# Patient Record
Sex: Male | Born: 1985 | Race: Black or African American | Hispanic: No | Marital: Single | Smoking: Never smoker
Health system: Southern US, Community
[De-identification: ages and names within clinical notes are randomized; demographics above are authoritative.]

## PROBLEM LIST (undated history)

## (undated) DIAGNOSIS — J45909 Unspecified asthma, uncomplicated: Secondary | ICD-10-CM

---

## 2009-01-31 ENCOUNTER — Emergency Department (HOSPITAL_COMMUNITY): Admission: EM | Admit: 2009-01-31 | Discharge: 2009-01-31 | Payer: Self-pay | Admitting: Emergency Medicine

## 2010-12-13 IMAGING — CR DG CHEST 2V
2 series · 2 of 2 positions shown · non-contrast
Comparison: None

CLINICAL DATA: Chest pain

CHEST - 2 VIEW

[w chest pa]
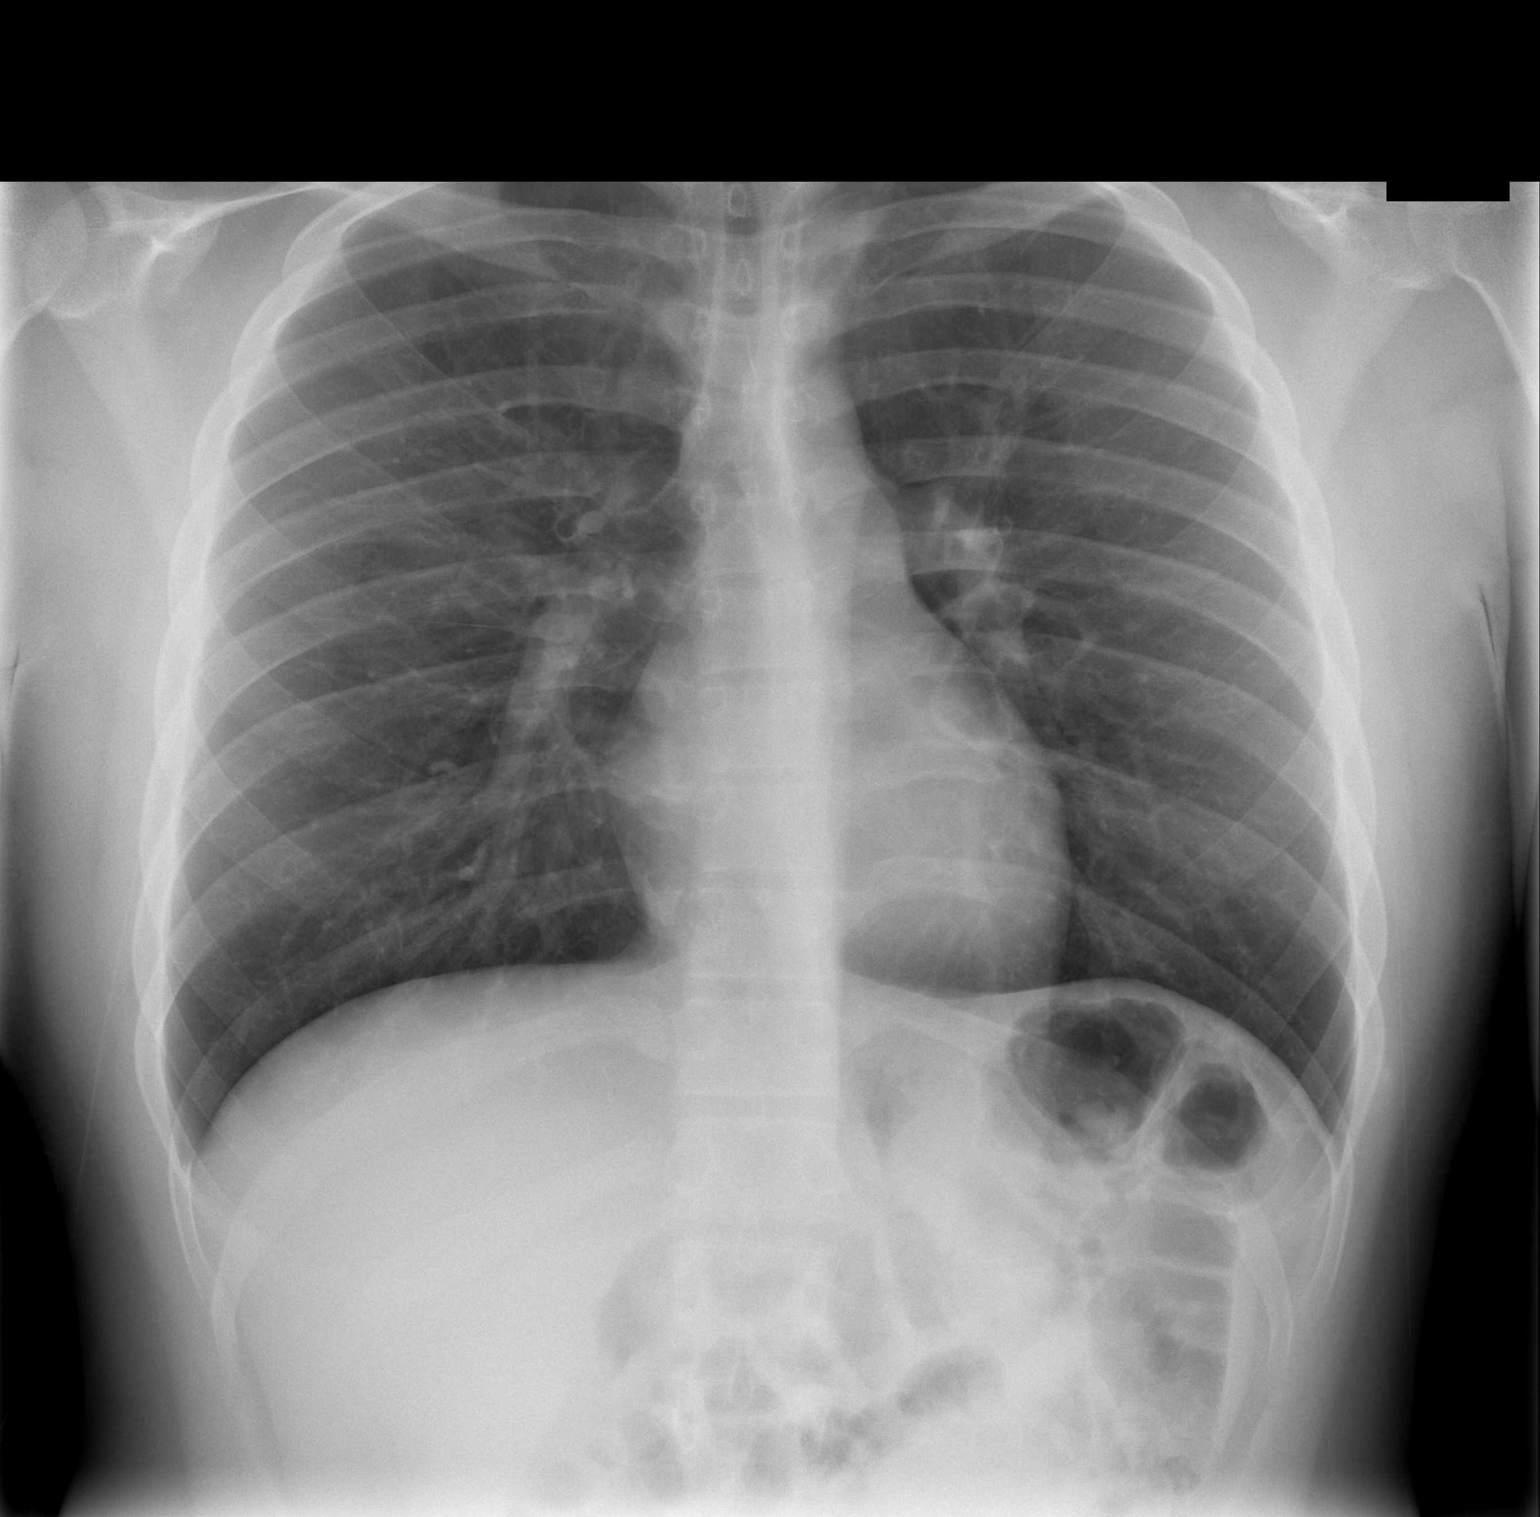

[w chest lat]
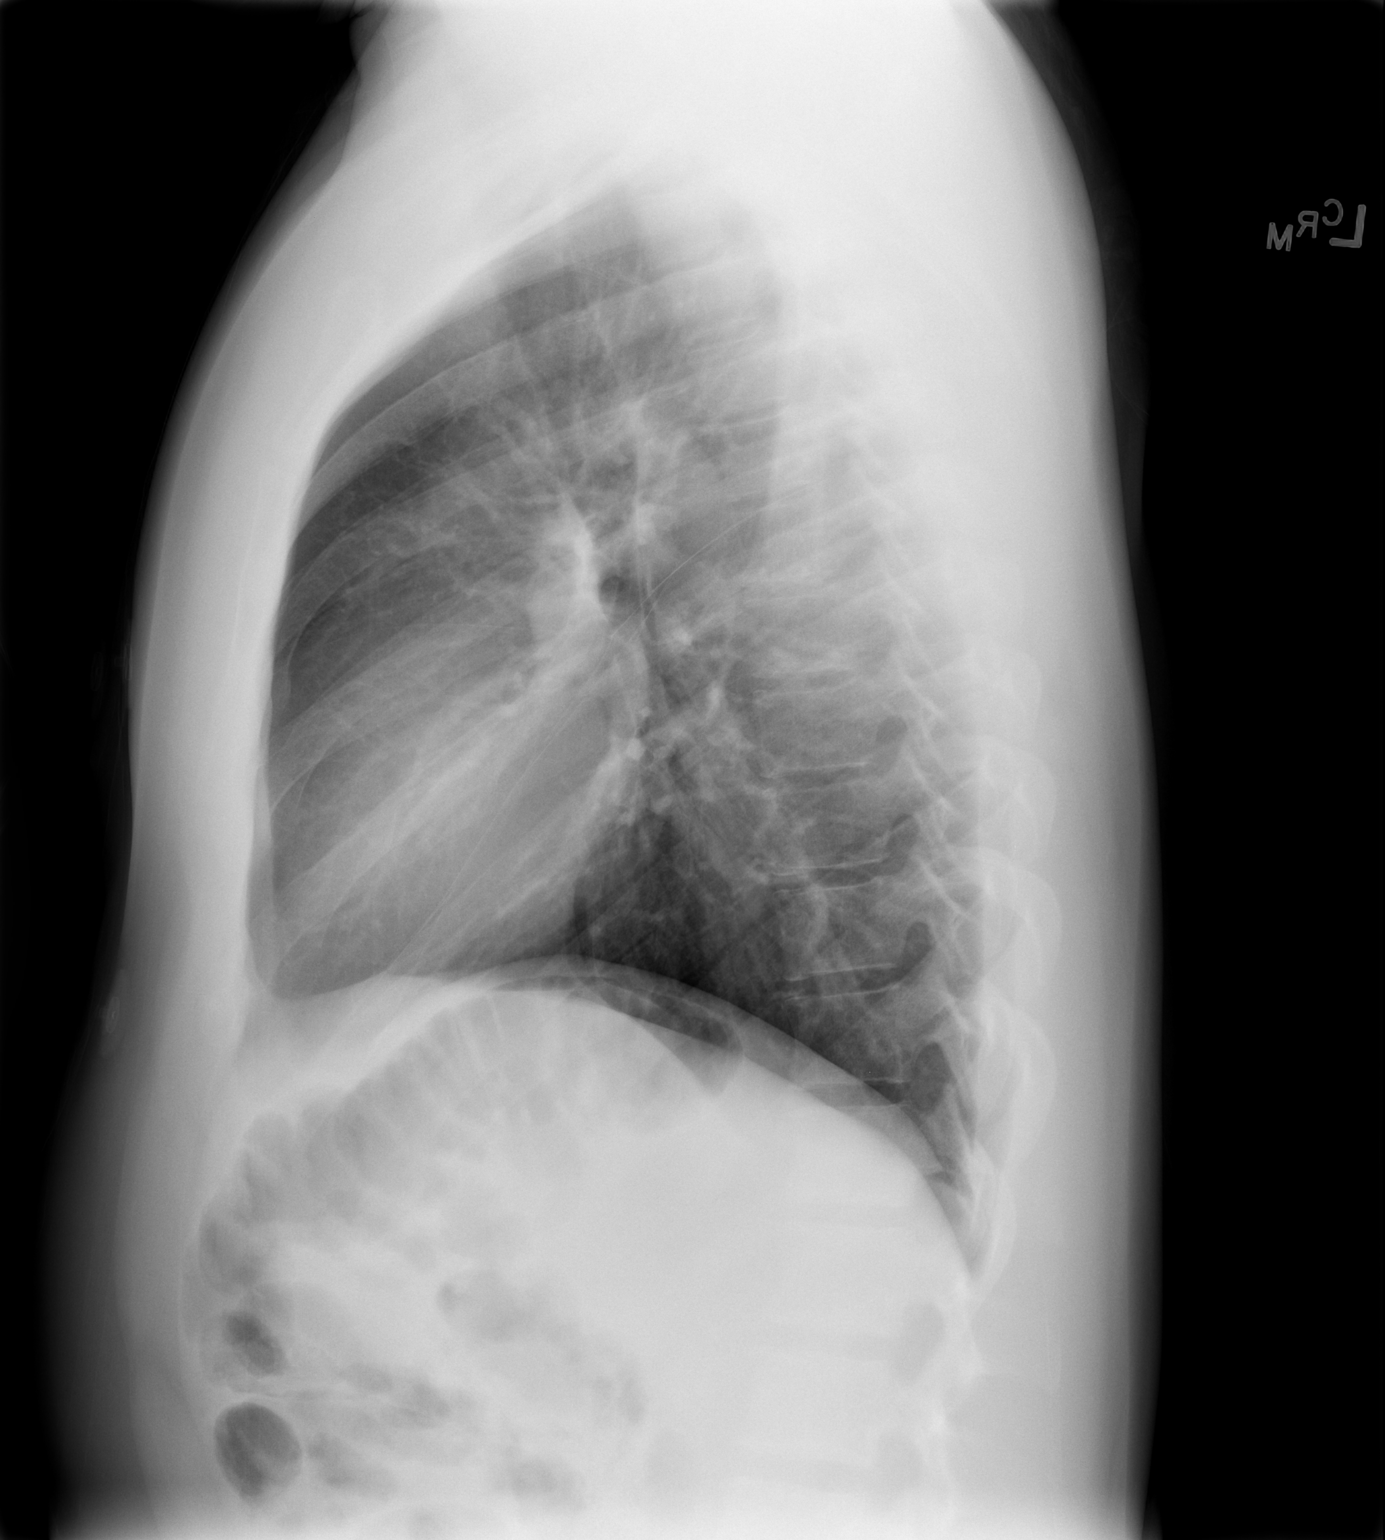

[2 of 2 positions shown; findings below may reference images not displayed]

FINDINGS: The heart size and mediastinal contours are within
normal limits.  Both lungs are clear.  The visualized skeletal
structures are unremarkable.
IMPRESSION: No active cardiopulmonary disease.

## 2013-10-06 ENCOUNTER — Encounter (HOSPITAL_COMMUNITY): Payer: Self-pay | Admitting: Emergency Medicine

## 2013-10-06 ENCOUNTER — Emergency Department (HOSPITAL_COMMUNITY): Payer: BC Managed Care – PPO

## 2013-10-06 ENCOUNTER — Emergency Department (HOSPITAL_COMMUNITY)
Admission: EM | Admit: 2013-10-06 | Discharge: 2013-10-06 | Disposition: A | Discharge status: Home / Self Care | Payer: BC Managed Care – PPO | Attending: Emergency Medicine | Admitting: Emergency Medicine

## 2013-10-06 DIAGNOSIS — J069 Acute upper respiratory infection, unspecified: Secondary | ICD-10-CM | POA: Insufficient documentation

## 2013-10-06 DIAGNOSIS — R059 Cough, unspecified: Secondary | ICD-10-CM | POA: Insufficient documentation

## 2013-10-06 DIAGNOSIS — J45901 Unspecified asthma with (acute) exacerbation: Secondary | ICD-10-CM | POA: Insufficient documentation

## 2013-10-06 DIAGNOSIS — R05 Cough: Secondary | ICD-10-CM | POA: Insufficient documentation

## 2013-10-06 HISTORY — DX: Unspecified asthma, uncomplicated: J45.909

## 2013-10-06 MED ORDER — BENZONATATE 100 MG PO CAPS: 100.0000 mg | ORAL_CAPSULE | Freq: Three times a day (TID) | ORAL | Status: DC

## 2013-10-06 NOTE — ED Provider Notes (Signed)
CSN: [id number]     Arrival date & time 10/06/13  [id number] History   First MD Initiated Contact with Patient 10/06/13 [phone]     Chief Complaint  Patient presents with  . Cough  . Sore Throat     (Consider location/radiation/quality/duration/timing/severity/associated sxs/prior Treatment) HPI Comments: Patient presents today with a chief complaint of a productive cough for the past week.  He states that the cough is gradually worsening.  Cough is worse in the morning.  He has not taken anything for his symptoms.  He reports associated SOB, but only after excessive coughing.  He also reports a "burning" sensation in his chest, but also only after excessive coughing.  He denies SOB or CP at this time.  He reports that in the morning he has irritation of his throat, but denies sore throat at this time.  He denies nasal congestion, ear pain, or sinus pressure.  He reports history of Asthma in childhood.  He reports that he smokes cigarettes occasionally.  Patient is a 28 y.o. male presenting with cough and pharyngitis. The history is provided by the patient.  Cough Associated symptoms: shortness of breath   Associated symptoms: no chest pain, no chills, no fever and no wheezing   Sore Throat Associated symptoms include coughing. Pertinent negatives include no chest pain, chills or fever.    Past Medical History  Diagnosis Date  . Asthma     in childhood   History reviewed. No pertinent past surgical history. No family history on file. History  Substance Use Topics  . Smoking status: Never Smoker   . Smokeless tobacco: Not on file  . Alcohol Use: Yes     Comment: occasionally    Review of Systems  Constitutional: Negative for fever and chills.  Respiratory: Positive for cough and shortness of breath. Negative for wheezing.   Cardiovascular: Negative for chest pain.      Allergies  Review of patient's allergies indicates no known allergies.  Home Medications   Prior to Admission  medications   Not on File   BP 128/83  Pulse 72  Temp(Src) 98.6 F (37 C) (Oral)  Resp 20  SpO2 100% Physical Exam  Nursing note and vitals reviewed. Constitutional: He appears well-developed and well-nourished.  HENT:  Head: Normocephalic and atraumatic.  Right Ear: Tympanic membrane and ear canal normal.  Left Ear: Tympanic membrane and ear canal normal.  Nose: Nose normal. Right sinus exhibits no maxillary sinus tenderness and no frontal sinus tenderness. Left sinus exhibits no maxillary sinus tenderness and no frontal sinus tenderness.  Mouth/Throat: Uvula is midline and oropharynx is clear and moist. No oropharyngeal exudate, posterior oropharyngeal edema or posterior oropharyngeal erythema.  Neck: Normal range of motion. Neck supple.  Cardiovascular: Normal rate, regular rhythm and normal heart sounds.   Pulmonary/Chest: Effort normal and breath sounds normal. No respiratory distress. He has no wheezes. He has no rales.  Lymphadenopathy:    He has no cervical adenopathy.  Neurological: He is alert.  Skin: Skin is warm and dry. He is not diaphoretic.  Psychiatric: He has a normal mood and affect.    ED Course  Procedures (including critical care time) Labs Review Labs Reviewed - No data to display  Imaging Review Dg Chest 2 View  10/06/2013   CLINICAL DATA:  Cough and chest pain  EXAM: CHEST  2 VIEW  COMPARISON:  01/31/2009  FINDINGS: The heart size and mediastinal contours are within normal limits. Both lungs are clear. The visualized  skeletal structures are unremarkable.  IMPRESSION: No active cardiopulmonary disease.   Electronically Signed   By: Christiana Pellant M.D.   On: 10/06/2013 11:02     EKG Interpretation None      MDM   Final diagnoses:  None   Pt CXR negative for acute infiltrate. Patients symptoms are consistent with URI, likely viral etiology. Discussed that antibiotics are not indicated for viral infections. Pt will be discharged with symptomatic  treatment.  Verbalizes understanding and is agreeable with plan. Pt is hemodynamically stable & in NAD prior to dc.  Patient stable for discharge.  Return precautions given.     Santiago Glad, PA-C 10/06/13 [phone]

## 2013-10-06 NOTE — ED Notes (Signed)
Pt reports cough x 1 week, with intermittent sore throat. Pt sts he recently got a new roommate who keeps AC on at night, pt sts he thinks that's what's causing cough. Denies chest pain, fever.

## 2013-10-06 NOTE — Discharge Instructions (Signed)
Read the instructions below on reasons to return to the emergency department and to learn more about your diagnosis.  Use over the counter medications for symptomatic relief as we discussed (mucinex as a decongestant, Tylenol for fever/pain, Motrin/Ibuprofen for muscle aches). Followup with your primary care doctor in 4 days if your symptoms persist.  Your more than welcome to return to the emergency department if symptoms worsen or become concerning. ° °Upper Respiratory Infection, Adult  °An upper respiratory infection (URI) is also sometimes known as the common cold. Most people improve within 1 week, but symptoms can last up to 2 weeks. A residual cough may last even longer.  ° °URI is most commonly caused by a virus. Viruses are NOT treated with antibiotics. You can easily spread the virus to others by oral contact. This includes kissing, sharing a glass, coughing, or sneezing. Touching your mouth or nose and then touching a surface, which is then touched by another person, can also spread the virus.  ° °TREATMENT  °Treatment is directed at relieving symptoms. There is no cure. Antibiotics are not effective, because the infection is caused by a virus, not by bacteria. Treatment may include:  °Increased fluid intake. Sports drinks offer valuable electrolytes, sugars, and fluids.  °Breathing heated mist or steam (vaporizer or shower).  °Eating chicken soup or other clear broths, and maintaining good nutrition.  °Getting plenty of rest.  °Using gargles or lozenges for comfort.  °Controlling fevers with ibuprofen or acetaminophen as directed by your caregiver.  °Increasing usage of your inhaler if you have asthma.  °Return to work when your temperature has returned to normal.  ° °SEEK MEDICAL CARE IF:  °After the first few days, you feel you are getting worse rather than better.  °You develop worsening shortness of breath, or brown or red sputum. These may be signs of pneumonia.  °You develop yellow or brown nasal  discharge or pain in the face, especially when you bend forward. These may be signs of sinusitis.  °You develop a fever, swollen neck glands, pain with swallowing, or white areas in the back of your throat. These may be signs of strep throat.  ° °

## 2013-10-06 NOTE — ED Notes (Addendum)
Pt reports nonproductive cough and sore throat for a week. Pt breath sounds clear.   Pt continues to reports receives these symptoms post sleeping with fan and/or cool temperatures. Pt reports new roommate sleeps as such this past week. Pt contributes symptoms to event.  PA at bedside.

## 2013-10-06 NOTE — ED Notes (Signed)
Bed: WTR5 Expected date:  Expected time:  Means of arrival:  Comments: 

## 2013-10-06 NOTE — ED Provider Notes (Signed)
Medical screening examination/treatment/procedure(s) were conducted as a shared visit with non-physician practitioner(s) and myself.  I personally evaluated the patient during the encounter.  Pt c/o non productive cough, and mild chest soreness w coughing hard.  Chest cta bil. Rrr.    Suzi RootsKevin E Klynn Linnemann, MD 10/06/13 [phone]

## 2014-02-22 ENCOUNTER — Ambulatory Visit (INDEPENDENT_AMBULATORY_CARE_PROVIDER_SITE_OTHER): Payer: BC Managed Care – PPO | Admitting: Physician Assistant

## 2014-02-22 VITALS — BP 134/74 | HR 82 | Temp 98.7°F | Resp 18 | Ht 66.5 in | Wt 167.2 lb

## 2014-02-22 DIAGNOSIS — G4452 New daily persistent headache (NDPH): Secondary | ICD-10-CM

## 2014-02-22 MED ORDER — IPRATROPIUM BROMIDE 0.03 % NA SOLN: 2.0000 | Freq: Two times a day (BID) | NASAL | Status: AC

## 2014-02-22 MED ORDER — DICLOFENAC SODIUM 75 MG PO TBEC: 75.0000 mg | DELAYED_RELEASE_TABLET | Freq: Two times a day (BID) | ORAL | Status: AC

## 2014-02-22 NOTE — Progress Notes (Signed)
Subjective:    Patient ID: Terry Terry, male    DOB: October 13, 1985, 28 y.o.   MRN: [id number]  HPI  This is a 28 year old male presenting with daily headaches for one month. He reports the headaches are present upon waking or after breakfast. The pain is described as constant and squeezing and is located in a ball-cap distribution. Pain rated 6/10. In addition he has pain at his temples and says his eyes feel irritated. With the pain he feels he need to rub his eyes or squint for relief. He has been taking BC powder every morning. Exercise and eating aggravates the pain. He notes when he eats "unhealthy" he experiences a headache more than he eats healthy. He doesn't drink caffeine, drink alcohol regularly or smoke. He drinks 48 oz water a day. He denies N/V, photophobia, phonophobia, visual disturbance, dizziness.  He reports when this all started he had a sinus infection. This resolved after 1.5 weeks. He has had more URI symptoms that started 1 week ago but these symptoms have also resolved at this point. He has had no changes in his sleep.  Review of Systems  Constitutional: Negative for fever and chills.  HENT: Negative for congestion, ear pain, sinus pressure and sore throat.   Eyes: Positive for pain. Negative for redness and visual disturbance.  Respiratory: Negative for cough.   Gastrointestinal: Negative for nausea, vomiting and diarrhea.  Skin: Negative for rash.  Neurological: Positive for headaches. Negative for dizziness.  Psychiatric/Behavioral: Negative for sleep disturbance.    There are no active problems to display for this patient.  Prior to Admission medications   Not on File   No Known Allergies  Patient's social and family history were reviewed.     Objective:   Physical Exam  Constitutional: He is oriented to person, place, and time. He appears well-developed and well-nourished. No distress.  HENT:  Head: Normocephalic and atraumatic.  Right Ear: Hearing,  external ear and ear canal normal. Tympanic membrane is retracted.  Left Ear: Hearing, external ear and ear canal normal.  Nose: Nose normal. No mucosal edema. Right sinus exhibits no maxillary sinus tenderness and no frontal sinus tenderness. Left sinus exhibits no maxillary sinus tenderness and no frontal sinus tenderness.  Mouth/Throat: Uvula is midline, oropharynx is clear and moist and mucous membranes are normal.  Left TM obstructed by cerumen.  Eyes: Conjunctivae, EOM and lids are normal. Pupils are equal, round, and reactive to light. Right eye exhibits no discharge. Left eye exhibits no discharge. No scleral icterus.  Vision screen with glasses: 20/15 in right eye, 20/15 in left eye, 20/15 with both  Neck: Trachea normal.  Cardiovascular: Normal rate, regular rhythm, normal heart sounds, intact distal pulses and normal pulses.   No murmur heard. Pulmonary/Chest: Effort normal and breath sounds normal. No respiratory distress. He has no wheezes. He has no rhonchi. He has no rales.  Musculoskeletal: Normal range of motion.       Cervical back: Normal.  Lymphadenopathy:       Head (right side): No submental, no submandibular, no tonsillar, no preauricular, no posterior auricular and no occipital adenopathy present.       Head (left side): No submental, no submandibular, no tonsillar, no preauricular, no posterior auricular and no occipital adenopathy present.    He has no cervical adenopathy.  Neurological: He is alert and oriented to person, place, and time. He has normal strength and normal reflexes. No cranial nerve deficit or sensory deficit.  Reflex Scores:      Bicep reflexes are 2+ on the right side and 2+ on the left side.      Patellar reflexes are 2+ on the right side and 2+ on the left side.      Achilles reflexes are 2+ on the right side and 2+ on the left side. Skin: Skin is warm, dry and intact. No lesion and no rash noted.  Psychiatric: He has a normal mood and affect. His  speech is normal and behavior is normal. Thought content normal.      Assessment & Plan:  1. New daily persistent headache Eustachian tube dysfunction vs. Tension headache vs. Rebound headache vs. Dehydration - Pt will d/c bc powder d/t potential of causing rebound headache. He will start taking voltaren BID x 10-14 days and will start using atrovent nasal spray BID. Advised to adequately hydrate and stay away from processed foods. Advised him to pay attention to headaches that occur after eating and make note of what he ate. He will return in 7-10 days if no improvement in his symptoms.  - diclofenac (VOLTAREN) 75 MG EC tablet; Take 1 tablet (75 mg total) by mouth 2 (two) times daily.  Dispense: 30 tablet; Refill: 0 - ipratropium (ATROVENT) 0.03 % nasal spray; Place 2 sprays into both nostrils 2 (two) times daily.  Dispense: 30 mL; Refill: 0   Amberlee Garvey V. Dyke BrackettBush, PA-C, [facility]  02/23/2014

## 2014-02-22 NOTE — Patient Instructions (Signed)
Stop taking BC powder. Start taking diclofenac twice a day. Increase your fluids to 64 oz a day. Watch your salt intake (decrease processed foods) Return in 2 weeks if not improving.

## 2014-02-23 NOTE — Progress Notes (Signed)
The patient was discussed with me and I agree with the diagnosis and treatment plan.  

## 2015-08-18 IMAGING — CR DG CHEST 2V
2 series · 2 of 2 positions shown · non-contrast
Comparison: 01/31/2009

CLINICAL DATA: Cough and chest pain

EXAM:
CHEST  2 VIEW

[w chest pa]
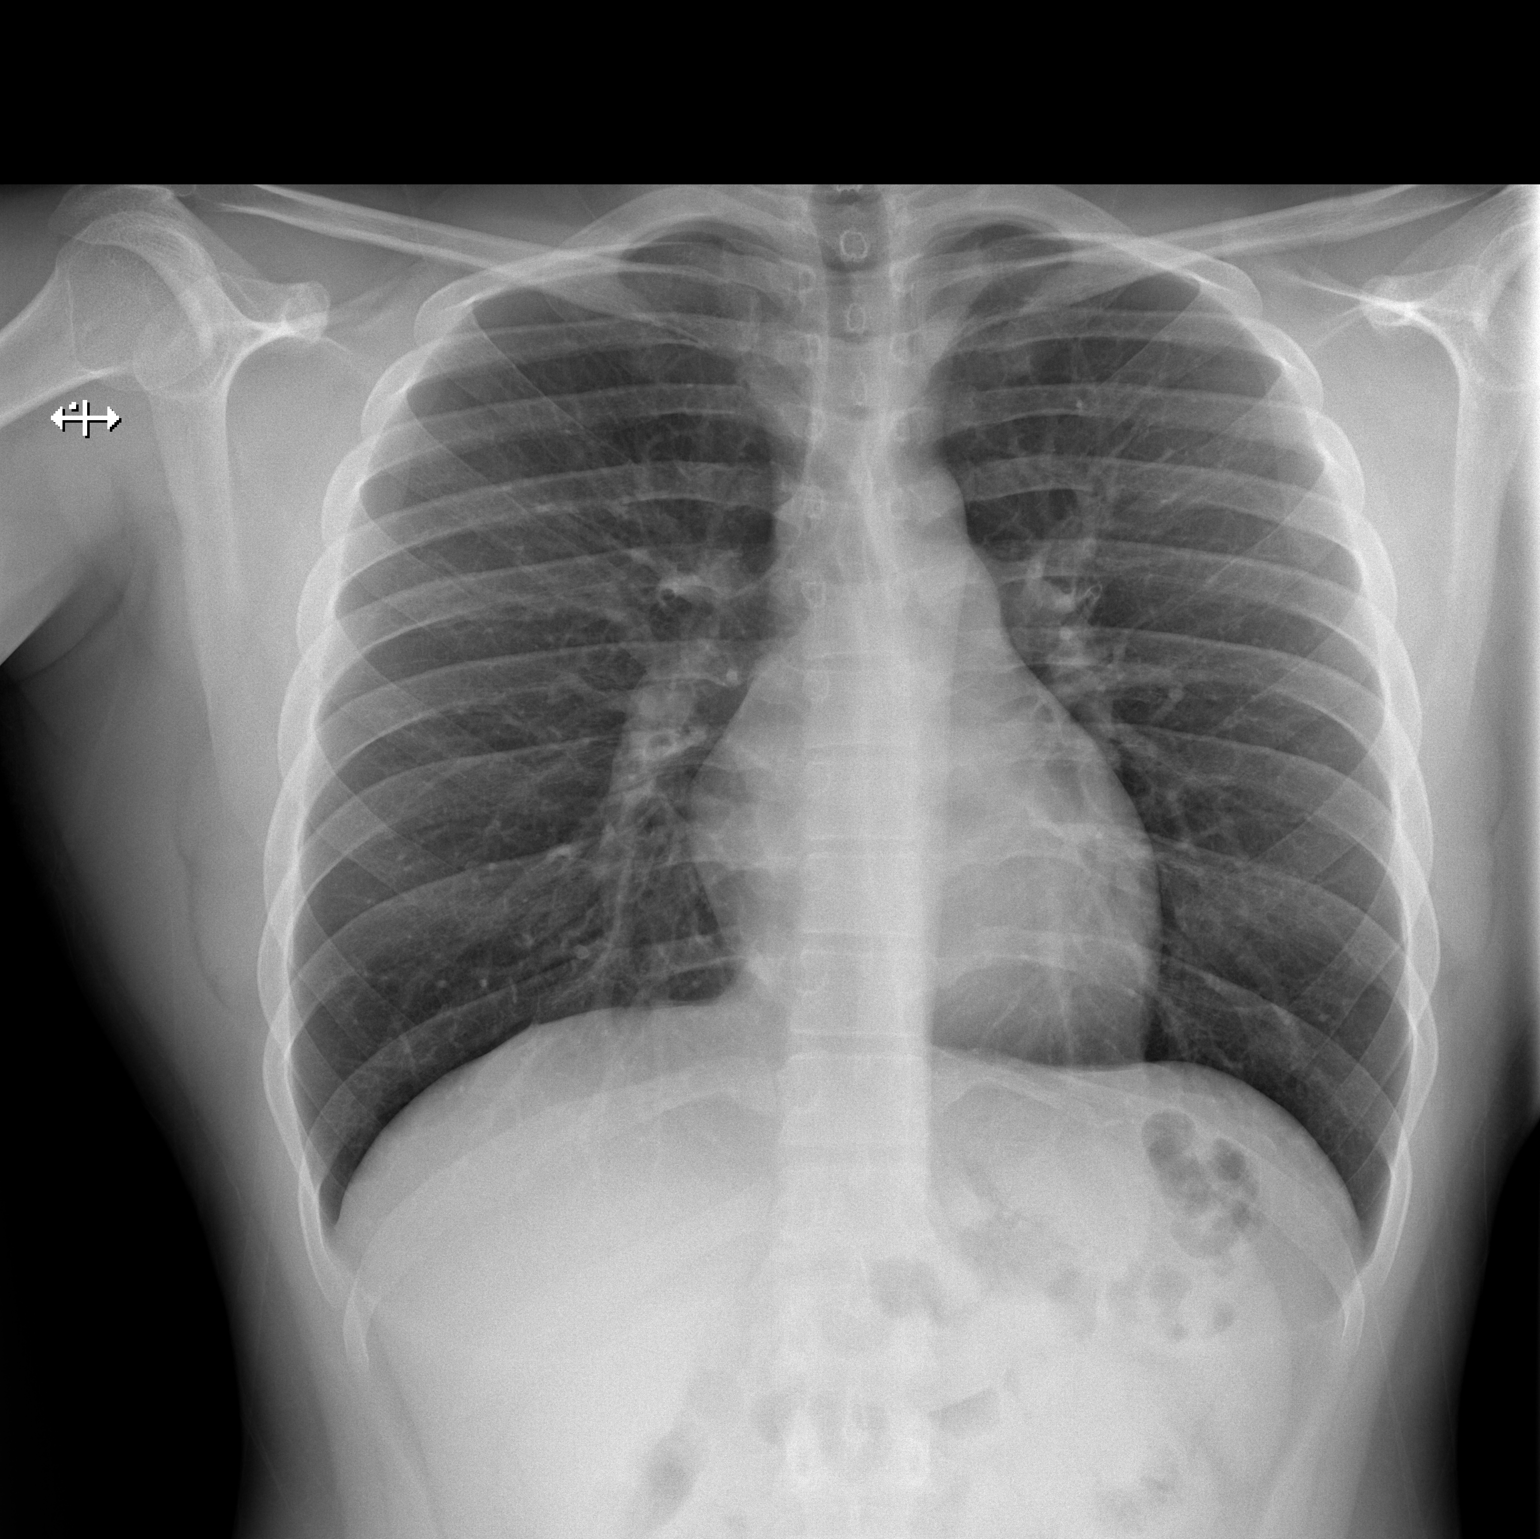

[w chest lat]
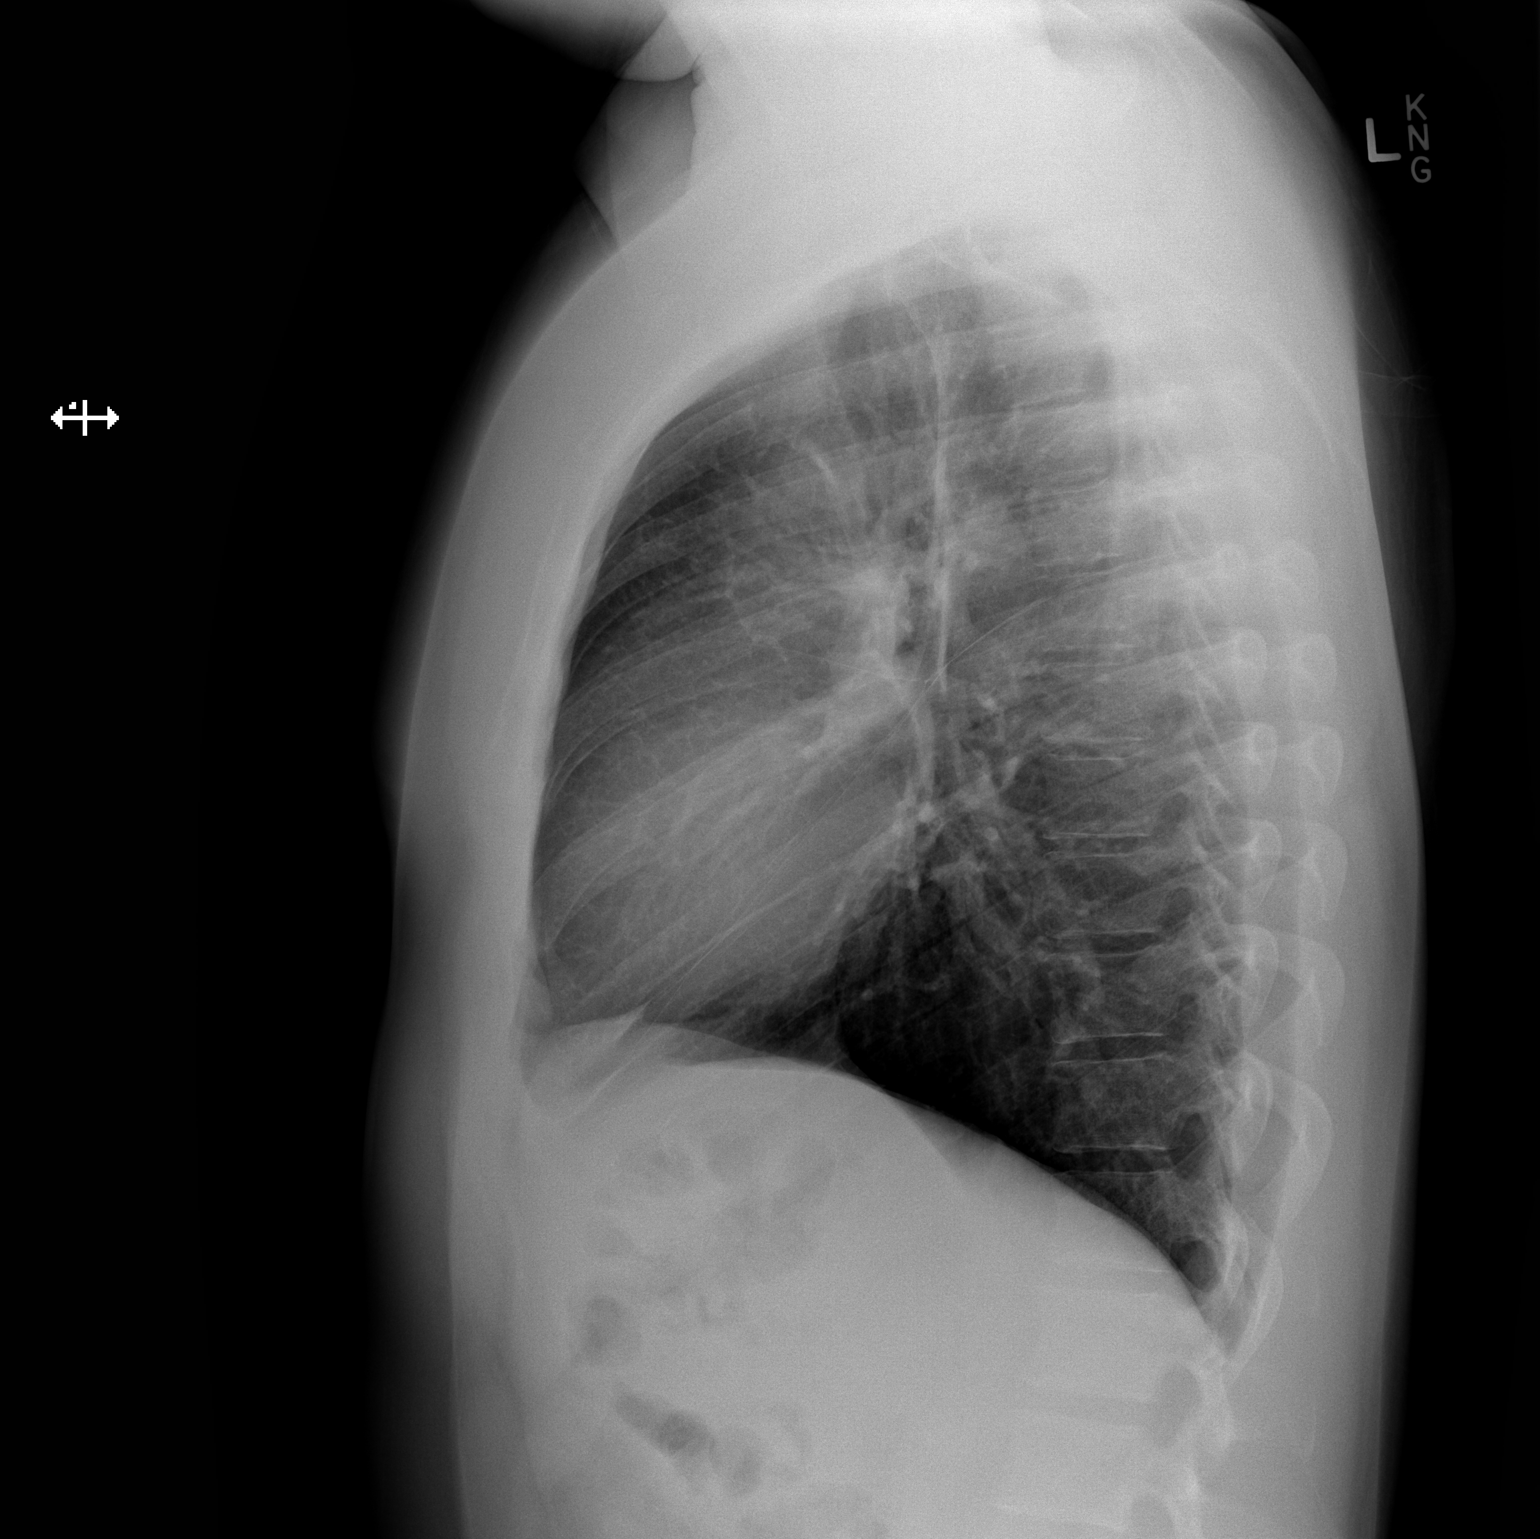

[2 of 2 positions shown; findings below may reference images not displayed]

FINDINGS: The heart size and mediastinal contours are within normal limits.
Both lungs are clear. The visualized skeletal structures are
unremarkable.
IMPRESSION: No active cardiopulmonary disease.
